# Patient Record
Sex: Female | Born: 1990 | Race: Black or African American | Hispanic: No | Marital: Married | Smoking: Former smoker
Health system: Southern US, Community
[De-identification: ages and names within clinical notes are randomized; demographics above are authoritative.]

## PROBLEM LIST (undated history)

## (undated) DIAGNOSIS — N200 Calculus of kidney: Secondary | ICD-10-CM

## (undated) HISTORY — PX: COSMETIC SURGERY: SHX468

---
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
## 2010-01-22 ENCOUNTER — Emergency Department (HOSPITAL_COMMUNITY): Admission: EM | Admit: 2010-01-22 | Discharge: 2010-01-23 | Payer: Self-pay | Admitting: Emergency Medicine

## 2011-03-16 LAB — WET PREP, GENITAL
Clue Cells Wet Prep HPF POC: NONE SEEN
Trich, Wet Prep: NONE SEEN

## 2011-03-16 LAB — GC/CHLAMYDIA PROBE AMP, GENITAL: Chlamydia, DNA Probe: NEGATIVE

## 2011-03-16 LAB — URINALYSIS, ROUTINE W REFLEX MICROSCOPIC
Bilirubin Urine: NEGATIVE
Glucose, UA: NEGATIVE mg/dL
Ketones, ur: NEGATIVE mg/dL
pH: 6 (ref 5.0–8.0)

## 2011-03-16 LAB — URINE MICROSCOPIC-ADD ON

## 2012-02-14 ENCOUNTER — Encounter (HOSPITAL_COMMUNITY): Payer: Self-pay | Admitting: *Deleted

## 2012-02-14 ENCOUNTER — Emergency Department (INDEPENDENT_AMBULATORY_CARE_PROVIDER_SITE_OTHER)
Admission: EM | Admit: 2012-02-14 | Discharge: 2012-02-14 | Disposition: A | Discharge status: Home / Self Care | Payer: Self-pay | Source: Home / Self Care | Attending: Emergency Medicine | Admitting: Emergency Medicine

## 2012-02-14 DIAGNOSIS — N938 Other specified abnormal uterine and vaginal bleeding: Secondary | ICD-10-CM

## 2012-02-14 DIAGNOSIS — N949 Unspecified condition associated with female genital organs and menstrual cycle: Secondary | ICD-10-CM

## 2012-02-14 DIAGNOSIS — N926 Irregular menstruation, unspecified: Secondary | ICD-10-CM

## 2012-02-14 HISTORY — DX: Calculus of kidney: N20.0

## 2012-02-14 LAB — POCT URINALYSIS DIP (DEVICE)
Bilirubin Urine: NEGATIVE
Glucose, UA: NEGATIVE mg/dL
Nitrite: NEGATIVE
Protein, ur: NEGATIVE mg/dL
Urobilinogen, UA: 0.2 mg/dL (ref 0.0–1.0)

## 2012-02-14 LAB — WET PREP, GENITAL: Trich, Wet Prep: NONE SEEN

## 2012-02-14 MED ORDER — IBUPROFEN 800 MG PO TABS: 800.0000 mg | ORAL_TABLET | Freq: Three times a day (TID) | ORAL | Status: AC

## 2012-02-14 MED ORDER — NORGESTIMATE-ETH ESTRADIOL 0.25-35 MG-MCG PO TABS: ORAL_TABLET | ORAL | Status: DC

## 2012-02-14 NOTE — ED Notes (Signed)
Pt with c/o abdominal cramping with onset of menstrual cycle x one week ago - worse cramps she has ever had - per pt has heavy bleeding with every period

## 2012-02-14 NOTE — ED Provider Notes (Signed)
Chief Complaint  Patient presents with  . Abdominal Cramping    History of Present Illness:  Nancy Knight is a 21 year old female who has had a prolonged menstrual period. This began 9 days ago and is still going on. She in general notes a history of prolonged, painful menses. This menstrual period is about the same is a normal flow. She denies any clots or tissue. She's had some crampy pains. The cramps are a little bit worse than normal menstrual flow but she normally has cramps with all her periods. The pain is controllable with ibuprofen. She felt feverish and chilled and felt nauseated. Her prior menstrual period was January 7 and was normal. She is sexually active only with a single female partner. She states it's been over 2 years since she's been sexually active with a female partner.  Review of Systems:  Other than noted above, the patient denies any of the following symptoms: Systemic:  No fever, chills, sweats, fatigue, or weight loss. GI:  No abdominal pain, nausea, anorexia, vomiting, diarrhea, constipation, melena or hematochezia. GU:  No dysuria, frequency, urgency, hematuria, vaginal discharge, itching, or abnormal vaginal bleeding. Skin:  No rash or itching.   PMFSH:  Past medical history, family history, social history, meds, and allergies were reviewed.  Physical Exam:   Vital signs:  BP 133/76  Pulse 78  Temp(Src) 98.6 F (37 C) (Oral)  Resp 18  SpO2 100%  LMP 02/06/2012 General:  Alert, oriented and in no distress. Lungs:  Breath sounds clear and equal bilaterally.  No wheezes, rales or rhonchi. Heart:  Regular rhythm.  No gallops or murmers. Abdomen:  Soft, flat and non-distended.  No organomegaly or mass.  No tenderness, guarding or rebound.  Bowel sounds normally active. Pelvic exam:  Normal external genitalia. Vaginal and cervical mucosa are normal. There was just a small amount of blood in the vaginal vault. No clots or tissue. The cervix was closed. No cervical motion  tenderness. Uterus normal size and nontender. No adnexal masses or tenderness. Skin:  Clear, warm and dry.  Labs:   Results for orders placed during the hospital encounter of 02/14/12  POCT URINALYSIS DIP (DEVICE)      Component Value Range   Glucose, UA NEGATIVE  NEGATIVE (mg/dL)   Bilirubin Urine NEGATIVE  NEGATIVE    Ketones, ur TRACE (*) NEGATIVE (mg/dL)   Specific Gravity, Urine 1.025  1.005 - 1.030    Hgb urine dipstick MODERATE (*) NEGATIVE    pH 5.5  5.0 - 8.0    Protein, ur NEGATIVE  NEGATIVE (mg/dL)   Urobilinogen, UA 0.2  0.0 - 1.0 (mg/dL)   Nitrite NEGATIVE  NEGATIVE    Leukocytes, UA NEGATIVE  NEGATIVE   POCT PREGNANCY, URINE      Component Value Range   Preg Test, Ur NEGATIVE  NEGATIVE   WET PREP, GENITAL      Component Value Range   Yeast Wet Prep HPF POC NONE SEEN  NONE SEEN    Trich, Wet Prep NONE SEEN  NONE SEEN    Clue Cells Wet Prep HPF POC FEW (*) NONE SEEN    WBC, Wet Prep HPF POC FEW (*) NONE SEEN      Assessment:   Diagnoses that have been ruled out:  None  Diagnoses that are still under consideration:  None  Final diagnoses:  Dysfunctional uterine bleeding    Plan:   1.  The following meds were prescribed:   New Prescriptions   IBUPROFEN (ADVIL,MOTRIN)  800 MG TABLET    Take 1 tablet (800 mg total) by mouth 3 (three) times daily.   NORGESTIMATE-ETHINYL ESTRADIOL (SPRINTEC 28) 0.25-35 MG-MCG TABLET    Take 1 TID for 1 week, then 1 daily for 3 weeks.   2.  The patient was instructed in symptomatic care and handouts were given. 3.  The patient was told to return if becoming worse in any way, if no better in 3 or 4 days, and given some red flag symptoms that would indicate earlier return.  Follow up:  The patient was told to follow up with [facility] for a Pap smear, pelvic exam, and consider a pelvic ultrasound.     Roque Lias, MD 02/14/12 2045

## 2012-02-14 NOTE — Discharge Instructions (Signed)
Abnormal Uterine Bleeding Abnormal uterine bleeding can have many causes. Some cases are simply treated, while others are more serious. There are several kinds of bleeding that is considered abnormal, including:  Bleeding between periods.     Bleeding after sexual intercourse.     Spotting anytime in the menstrual cycle.     Bleeding heavier or more than normal.     Bleeding after menopause.  CAUSES   There are many causes of abnormal uterine bleeding. It can be present in teenagers, pregnant women, women during their reproductive years, and women who have reached menopause. Your caregiver will look for the more common causes depending on your age, signs, symptoms and your particular circumstance. Most cases are not serious and can be treated. Even the more serious causes, like cancer of the female organs, can be treated adequately if found in the early stages. That is why all types of bleeding should be evaluated and treated as soon as possible. DIAGNOSIS   Diagnosing the cause may take several kinds of tests. Your caregiver may:  Take a complete history of the type of bleeding.     Perform a complete physical exam and Pap smear.     Take an ultrasound on the abdomen showing a picture of the female organs and the pelvis.     Inject dye into the uterus and Fallopian tubes and X-ray them (hysterosalpingogram).     Place fluid in the uterus and do an ultrasound (sonohysterogrqphy).     Take a CT scan to examine the female organs and pelvis.     Take an MRI to examine the female organs and pelvis. There is no X-ray involved with this procedure.     Look inside the uterus with a telescope that has a light at the end (hysteroscopy).     Scrap the inside of the uterus to get tissue to examine (Dilatation and Curettage, D&C).     Look into the pelvis with a telescope that has a light at the end (laparoscopy). This is done through a very small cut (incision) in the abdomen.  TREATMENT     Treatment will depend on the cause of the abnormal bleeding. It can include:  Doing nothing to allow the problem to take care of itself over time.     Hormone treatment.     Birth control pills.     Treating the medical condition causing the problem.     Laparoscopy.    Major or minor surgery     Destroying the lining of the uterus with electrical currant, laser, freezing or heat (uterine ablation).  HOME CARE INSTRUCTIONS    Follow your caregiver's recommendation on how to treat your problem.     See your caregiver if you missed a menstrual period and think you may be pregnant.     If you are bleeding heavily, count the number of pads/tampons you use and how often you have to change them. Tell this to your caregiver.     Avoid sexual intercourse until the problem is controlled.  SEEK MEDICAL CARE IF:    You have any kind of abnormal bleeding mentioned above.     You feel dizzy at times.     You are 21 years old and have not had a menstrual period yet.  SEEK IMMEDIATE MEDICAL CARE IF:    You pass out.     You are changing pads/tampons every 15 to 30 minutes.     You have belly (abdominal)   pain.     You have a temperature of 100 F (37.8 C) or higher.     You become sweaty or weak.     You are passing large blood clots from the vagina.     You start to feel sick to your stomach (nauseous) and throw up (vomit).  Document Released: 12/15/2005 Document Revised: 08/27/2011 Document Reviewed: 05/10/2009 [employer] Patient Information 2012 [employer]. 

## 2012-02-17 LAB — GC/CHLAMYDIA PROBE AMP, GENITAL
Chlamydia, DNA Probe: NEGATIVE
GC Probe Amp, Genital: POSITIVE — AB

## 2012-02-18 MED ORDER — CEFTRIAXONE SODIUM 250 MG IJ SOLR: 250.0000 mg | Freq: Once | INTRAMUSCULAR | Status: AC

## 2012-02-18 MED ORDER — AZITHROMYCIN 250 MG PO TABS: 1000.0000 mg | ORAL_TABLET | Freq: Once | ORAL | Status: AC

## 2012-02-20 ENCOUNTER — Emergency Department (INDEPENDENT_AMBULATORY_CARE_PROVIDER_SITE_OTHER)
Admission: EM | Admit: 2012-02-20 | Discharge: 2012-02-20 | Disposition: A | Discharge status: Home / Self Care | Payer: Self-pay | Source: Home / Self Care | Attending: Emergency Medicine | Admitting: Emergency Medicine

## 2012-02-20 ENCOUNTER — Encounter (HOSPITAL_COMMUNITY): Payer: Self-pay | Admitting: *Deleted

## 2012-02-20 DIAGNOSIS — A549 Gonococcal infection, unspecified: Secondary | ICD-10-CM

## 2012-02-20 DIAGNOSIS — A5400 Gonococcal infection of lower genitourinary tract, unspecified: Secondary | ICD-10-CM

## 2012-02-20 LAB — POCT URINALYSIS DIP (DEVICE): Leukocytes, UA: NEGATIVE

## 2012-02-20 LAB — POCT PREGNANCY, URINE: Preg Test, Ur: NEGATIVE

## 2012-02-20 MED ORDER — AZITHROMYCIN 250 MG PO TABS
1000.0000 mg | ORAL_TABLET | Freq: Every day | ORAL | Status: DC
  Administered 2012-02-20 (×3): 1000 mg via ORAL

## 2012-02-20 MED ORDER — AZITHROMYCIN 250 MG PO TABS
ORAL_TABLET | ORAL | Status: AC
  Filled 2012-02-20: qty 4

## 2012-02-20 MED ORDER — CEFTRIAXONE SODIUM 250 MG IJ SOLR
250.0000 mg | Freq: Once | INTRAMUSCULAR | Status: AC
  Administered 2012-02-20: 250 mg via INTRAMUSCULAR

## 2012-02-20 MED ORDER — CEFTRIAXONE SODIUM 250 MG IJ SOLR
INTRAMUSCULAR | Status: AC
  Filled 2012-02-20: qty 250

## 2012-02-20 NOTE — ED Notes (Signed)
Pt  Only  Given        zithromax  1  Gm  And  rocephen 250  Mg im       1  Dose  Each

## 2012-02-20 NOTE — ED Provider Notes (Signed)
History     CSN: [id number]  Arrival date & time 02/20/12  1635   First MD Initiated Contact with Patient 02/20/12 1720      Chief Complaint  Patient presents with  . SEXUALLY TRANSMITTED DISEASE    (Consider location/radiation/quality/duration/timing/severity/associated sxs/prior treatment) HPI Comments: Patient presented today to urgent care after receiving a phone call from our clinic in which she was explained that she tested positive for gonorrhea after having been screened by one of our providers. Patient denies any pelvic pain or fevers.  The history is provided by the patient.    Past Medical History  Diagnosis Date  . Kidney stone     Past Surgical History  Procedure Date  . Cosmetic surgery     History reviewed. No pertinent family history.  History  Substance Use Topics  . Smoking status: Former Games developer  . Smokeless tobacco: Not on file  . Alcohol Use: Yes    OB History    Grav Para Term Preterm Abortions TAB SAB Ect Mult Living                  Review of Systems  Constitutional: Negative for fever, appetite change and fatigue.  Cardiovascular: Negative for chest pain.  Genitourinary: Negative for dysuria, urgency, vaginal bleeding, vaginal discharge, vaginal pain and pelvic pain.  Neurological: Negative for dizziness.    Allergies  Review of patient's allergies indicates no known allergies.  Home Medications   Current Outpatient Rx  Name Route Sig Dispense Refill  . ACETAMINOPHEN 325 MG PO TABS Oral Take 650 mg by mouth every 6 (six) hours as needed.    . IBUPROFEN 800 MG PO TABS Oral Take 800 mg by mouth every 8 (eight) hours as needed.    . IBUPROFEN 800 MG PO TABS Oral Take 1 tablet (800 mg total) by mouth 3 (three) times daily. 30 tablet 0  . NAPROXEN SODIUM 220 MG PO TABS Oral Take 220 mg by mouth 2 (two) times daily with a meal.    . NORGESTIMATE-ETH ESTRADIOL 0.25-35 MG-MCG PO TABS  Take 1 TID for 1 week, then 1 daily for 3 weeks. 2  Package 11    BP 125/57  Pulse 88  Temp(Src) 98.3 F (36.8 C) (Oral)  Resp 18  SpO2 98%  LMP 02/06/2012  Physical Exam  Nursing note and vitals reviewed. Constitutional: She appears well-developed and well-nourished.  HENT:  Mouth/Throat: No oropharyngeal exudate.  Eyes: Conjunctivae are normal. No scleral icterus.  Neck: Neck supple. No JVD present.  Abdominal: Soft. She exhibits no distension. There is no hepatosplenomegaly or hepatomegaly. There is no tenderness. There is no rigidity, no rebound, no guarding, no tenderness at McBurney's point and negative Murphy's sign.  Genitourinary: Vagina normal.  Lymphadenopathy:    She has no cervical adenopathy.  Skin: No rash noted. No erythema.    ED Course  Procedures (including critical care time)  Labs Reviewed  POCT URINALYSIS DIP (DEVICE) - Abnormal; Notable for the following:    Ketones, ur TRACE (*)    All other components within normal limits  POCT PREGNANCY, URINE   No results found.   1. Gonorrhea       MDM  Patient returns today to receive treatment for positive gonorrhea test. Patient denies any pelvic pain any fevers or gastrointestinal symptoms.        Jimmie Molly, MD 02/20/12 [phone]

## 2012-02-20 NOTE — ED Notes (Signed)
Pt  Has  Positive    Lab  Results     She  Is  Here  To  Receive     meds  For   Positive  Lab  Results

## 2012-02-20 NOTE — ED Notes (Signed)
Here  For  Med  For  gc

## 2012-02-23 ENCOUNTER — Emergency Department (HOSPITAL_COMMUNITY)
Admission: EM | Admit: 2012-02-23 | Discharge: 2012-02-23 | Disposition: A | Discharge status: Home / Self Care | Payer: Self-pay | Attending: Emergency Medicine | Admitting: Emergency Medicine

## 2012-02-23 ENCOUNTER — Emergency Department (HOSPITAL_COMMUNITY): Payer: Self-pay

## 2012-02-23 ENCOUNTER — Encounter (HOSPITAL_COMMUNITY): Payer: Self-pay | Admitting: *Deleted

## 2012-02-23 DIAGNOSIS — T148XXA Other injury of unspecified body region, initial encounter: Secondary | ICD-10-CM

## 2012-02-23 DIAGNOSIS — R040 Epistaxis: Secondary | ICD-10-CM | POA: Insufficient documentation

## 2012-02-23 DIAGNOSIS — R062 Wheezing: Secondary | ICD-10-CM | POA: Insufficient documentation

## 2012-02-23 DIAGNOSIS — R05 Cough: Secondary | ICD-10-CM | POA: Insufficient documentation

## 2012-02-23 DIAGNOSIS — R509 Fever, unspecified: Secondary | ICD-10-CM | POA: Insufficient documentation

## 2012-02-23 DIAGNOSIS — J40 Bronchitis, not specified as acute or chronic: Secondary | ICD-10-CM | POA: Insufficient documentation

## 2012-02-23 DIAGNOSIS — S1093XA Contusion of unspecified part of neck, initial encounter: Secondary | ICD-10-CM | POA: Insufficient documentation

## 2012-02-23 DIAGNOSIS — S0003XA Contusion of scalp, initial encounter: Secondary | ICD-10-CM | POA: Insufficient documentation

## 2012-02-23 DIAGNOSIS — S0990XA Unspecified injury of head, initial encounter: Secondary | ICD-10-CM | POA: Insufficient documentation

## 2012-02-23 DIAGNOSIS — R51 Headache: Secondary | ICD-10-CM | POA: Insufficient documentation

## 2012-02-23 DIAGNOSIS — R059 Cough, unspecified: Secondary | ICD-10-CM | POA: Insufficient documentation

## 2012-02-23 DIAGNOSIS — R220 Localized swelling, mass and lump, head: Secondary | ICD-10-CM | POA: Insufficient documentation

## 2012-02-23 MED ORDER — ALBUTEROL SULFATE HFA 108 (90 BASE) MCG/ACT IN AERS
2.0000 | INHALATION_SPRAY | RESPIRATORY_TRACT | Status: DC | PRN
  Administered 2012-02-23: 2 via RESPIRATORY_TRACT
  Filled 2012-02-23: qty 6.7

## 2012-02-23 MED ORDER — HYDROCOD POLST-CHLORPHEN POLST 10-8 MG/5ML PO LQCR
5.0000 mL | Freq: Once | ORAL | Status: AC
  Administered 2012-02-23: 5 mL via ORAL
  Filled 2012-02-23: qty 5

## 2012-02-23 MED ORDER — BENZONATATE 100 MG PO CAPS: 100.0000 mg | ORAL_CAPSULE | Freq: Three times a day (TID) | ORAL | Status: AC
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
## 2012-02-23 NOTE — ED Notes (Signed)
Ambulated to Radiology and back. Coughing.
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
## 2012-02-24 ENCOUNTER — Telehealth (HOSPITAL_COMMUNITY): Payer: Self-pay | Admitting: *Deleted
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
## 2012-02-24 NOTE — ED Provider Notes (Signed)
Medical screening examination/treatment/procedure(s) were performed by non-physician practitioner and as supervising physician I was immediately available for consultation/collaboration.   Benny Lennert, MD 02/24/12 0030

## 2013-11-16 ENCOUNTER — Emergency Department (HOSPITAL_COMMUNITY)
Admission: EM | Admit: 2013-11-16 | Discharge: 2013-11-16 | Disposition: A | Discharge status: Home / Self Care | Payer: No Typology Code available for payment source | Attending: Emergency Medicine | Admitting: Emergency Medicine

## 2013-11-16 DIAGNOSIS — S46909A Unspecified injury of unspecified muscle, fascia and tendon at shoulder and upper arm level, unspecified arm, initial encounter: Secondary | ICD-10-CM | POA: Insufficient documentation

## 2013-11-16 DIAGNOSIS — Z87442 Personal history of urinary calculi: Secondary | ICD-10-CM | POA: Insufficient documentation

## 2013-11-16 DIAGNOSIS — S0993XA Unspecified injury of face, initial encounter: Secondary | ICD-10-CM | POA: Insufficient documentation

## 2013-11-16 DIAGNOSIS — Y9289 Other specified places as the place of occurrence of the external cause: Secondary | ICD-10-CM | POA: Insufficient documentation

## 2013-11-16 DIAGNOSIS — M542 Cervicalgia: Secondary | ICD-10-CM

## 2013-11-16 DIAGNOSIS — S4980XA Other specified injuries of shoulder and upper arm, unspecified arm, initial encounter: Secondary | ICD-10-CM | POA: Insufficient documentation

## 2013-11-16 DIAGNOSIS — Y9389 Activity, other specified: Secondary | ICD-10-CM | POA: Insufficient documentation

## 2013-11-16 DIAGNOSIS — Z87891 Personal history of nicotine dependence: Secondary | ICD-10-CM | POA: Insufficient documentation

## 2013-11-16 DIAGNOSIS — Z791 Long term (current) use of non-steroidal anti-inflammatories (NSAID): Secondary | ICD-10-CM | POA: Insufficient documentation

## 2013-11-16 MED ORDER — CYCLOBENZAPRINE HCL 10 MG PO TABS: 10.0000 mg | ORAL_TABLET | Freq: Two times a day (BID) | ORAL | Status: AC | PRN

## 2013-11-16 MED ORDER — NAPROXEN 250 MG PO TABS
500.0000 mg | ORAL_TABLET | Freq: Once | ORAL | Status: AC
  Administered 2013-11-16: 500 mg via ORAL
  Filled 2013-11-16: qty 2

## 2013-11-16 MED ORDER — NAPROXEN 500 MG PO TABS: 500.0000 mg | ORAL_TABLET | Freq: Two times a day (BID) | ORAL | Status: AC
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
## 2013-11-21 NOTE — ED Provider Notes (Signed)
Medical screening examination/treatment/procedure(s) were performed by non-physician practitioner and as supervising physician I was immediately available for consultation/collaboration.  EKG Interpretation   None        Kaidynce Pfister R. Asiya Cutbirth, MD 11/21/13 1556 

## 2020-12-22 ENCOUNTER — Emergency Department (HOSPITAL_COMMUNITY)
Admission: EM | Admit: 2020-12-22 | Discharge: 2020-12-22 | Disposition: A | Discharge status: Home / Self Care | Payer: Managed Care, Other (non HMO) | Attending: Pediatric Gastroenterology | Admitting: Pediatric Gastroenterology

## 2020-12-22 ENCOUNTER — Encounter (HOSPITAL_COMMUNITY): Payer: Self-pay

## 2020-12-22 ENCOUNTER — Emergency Department (HOSPITAL_COMMUNITY): Payer: Managed Care, Other (non HMO)

## 2020-12-22 ENCOUNTER — Other Ambulatory Visit: Payer: Self-pay

## 2020-12-22 DIAGNOSIS — Z20822 Contact with and (suspected) exposure to covid-19: Secondary | ICD-10-CM | POA: Diagnosis not present

## 2020-12-22 DIAGNOSIS — Z87891 Personal history of nicotine dependence: Secondary | ICD-10-CM | POA: Diagnosis not present

## 2020-12-22 DIAGNOSIS — J029 Acute pharyngitis, unspecified: Secondary | ICD-10-CM | POA: Diagnosis not present

## 2020-12-22 DIAGNOSIS — R21 Rash and other nonspecific skin eruption: Secondary | ICD-10-CM | POA: Insufficient documentation

## 2020-12-22 LAB — RESP PANEL BY RT-PCR (FLU A&B, COVID) ARPGX2
Influenza A by PCR: NEGATIVE
Influenza B by PCR: NEGATIVE
SARS Coronavirus 2 by RT PCR: NEGATIVE

## 2020-12-22 LAB — GROUP A STREP BY PCR: Group A Strep by PCR: NOT DETECTED

## 2020-12-22 MED ORDER — ACETAMINOPHEN 325 MG PO TABS
650.0000 mg | ORAL_TABLET | Freq: Once | ORAL | Status: AC | PRN
  Administered 2020-12-22: 650 mg via ORAL
  Filled 2020-12-22: qty 2

## 2020-12-22 MED ORDER — FLUCONAZOLE 150 MG PO TABS: 150.0000 mg | ORAL_TABLET | Freq: Once | ORAL | 0 refills | Status: AC

## 2020-12-22 MED ORDER — CEPHALEXIN 500 MG PO CAPS: 500.0000 mg | ORAL_CAPSULE | Freq: Two times a day (BID) | ORAL | 0 refills | Status: AC

## 2020-12-22 MED ORDER — CEPHALEXIN 500 MG PO CAPS: 500.0000 mg | ORAL_CAPSULE | Freq: Two times a day (BID) | ORAL | 0 refills | Status: DC

## 2020-12-22 NOTE — ED Provider Notes (Signed)
[facility] EMERGENCY DEPARTMENT Provider Note   CSN: [id number] Arrival date & time: 12/22/20  3536     History Chief Complaint  Patient presents with  . Rash  . Sore Throat    Nancy Knight is a 29 y.o. female who presents with concern for sore throat and painful rash around Nancy Knight mouth x4 days. Patient states Nancy Knight has been around Nancy Knight sick nephews, slept on a "filthy couch" at the place Nancy Knight gets Nancy Knight nails done. Nancy Knight states that children live there and Nancy Knight erupted with this rash the same evening Nancy Knight got Nancy Knight nails done.   Nancy Knight states that the "blisters" have burst, draining clear to yellow fluid.  Additionally complaining of sore throat, but denies swelling in Nancy Knight neck, difficulty swallowing.  Denies sores in Nancy Knight mouth.  Denies fevers or chills at home.  Endorses congestion, but denies shortness of breath or cough, palpitations, abdominal pain, nausea, vomiting, diarrhea.  Patient is fully vaccinated COVID-19.  I personally reviewed the patient's medical records.  History of kidney stones, cosmetic surgery, but otherwise carries no medical diagnoses and is not on any medications every day.     HPI     Past Medical History:  Diagnosis Date  . Kidney stone     There are no problems to display for this patient.   Past Surgical History:  Procedure Laterality Date  . COSMETIC SURGERY       OB History   No obstetric history on file.     No family history on file.  Social History   Tobacco Use  . Smoking status: Former Smoker  Substance Use Topics  . Alcohol use: Yes  . Drug use: Yes    Types: Marijuana    Home Medications Prior to Admission medications   Medication Sig Start Date End Date Taking? Authorizing Provider  cephALEXin (KEFLEX) 500 MG capsule Take 1 capsule (500 mg total) by mouth 2 (two) times daily for 7 days. 12/22/20 12/29/20  Jionni Helming, Lupe Carney R, PA-C  cyclobenzaprine (FLEXERIL) 10 MG tablet Take 1 tablet (10 mg total) by mouth  2 (two) times daily as needed for muscle spasms. 11/16/13   Emilia Beck, PA-C  ibuprofen (ADVIL,MOTRIN) 200 MG tablet Take 800 mg by mouth every 8 (eight) hours as needed for cramping.    [provider]  naproxen (NAPROSYN) 500 MG tablet Take 1 tablet (500 mg total) by mouth 2 (two) times daily with a meal. 11/16/13   Szekalski, Kaitlyn, PA-C  naproxen sodium (ANAPROX) 220 MG tablet Take 440 mg by mouth 2 (two) times daily as needed (for cramps).    [provider]    Allergies    Patient has no known allergies.  Review of Systems   Review of Systems  Constitutional: Positive for chills. Negative for activity change, appetite change, diaphoresis, fatigue and fever.  HENT: Positive for congestion, sinus pressure, sinus pain and sore throat. Negative for trouble swallowing and voice change.   Eyes: Negative.   Respiratory: Negative for cough, chest tightness, shortness of breath and wheezing.   Cardiovascular: Negative for chest pain, palpitations and leg swelling.  Gastrointestinal: Negative for abdominal pain, nausea and vomiting.  Genitourinary: Negative.   Musculoskeletal: Negative.   Skin: Positive for rash.  Allergic/Immunologic: Negative.   Neurological: Negative.   Hematological: Negative.     Physical Exam Updated Vital Signs BP (!) 147/91   Pulse 96   Temp 99.5 F (37.5 C) (Oral)   Resp 18   LMP  11/17/2020 Comment: patient states Nancy Knight period should be in 3 days. Nancy Knight is on birth control and not sexually active currently  SpO2 97%   Physical Exam Vitals and nursing note reviewed.  Constitutional:      Appearance: Nancy Knight is obese.  HENT:     Head: Normocephalic and atraumatic.     Right Ear: External ear normal.     Left Ear: External ear normal.     Nose: Nose normal.     Mouth/Throat:     Mouth: Mucous membranes are moist.     Pharynx: Uvula midline. Oropharyngeal exudate and posterior oropharyngeal erythema present. No uvula swelling.      Tonsils: Tonsillar exudate present. 2+ on the right. 2+ on the left.      Comments: Posterior pharyngeal white exudate. Bilateral symmetric tonsillar edema, white exudate on tonsils. Eyes:     General:        Right eye: No discharge.        Left eye: No discharge.     Extraocular Movements: Extraocular movements intact.     Conjunctiva/sclera: Conjunctivae normal.     Pupils: Pupils are equal, round, and reactive to light.  Neck:     Trachea: Trachea and phonation normal.  Cardiovascular:     Rate and Rhythm: Normal rate and regular rhythm.     Pulses: Normal pulses.          Radial pulses are 2+ on the right side and 2+ on the left side.       Dorsalis pedis pulses are 2+ on the right side and 2+ on the left side.     Heart sounds: Normal heart sounds. No murmur heard.   Pulmonary:     Effort: Pulmonary effort is normal. No respiratory distress.     Breath sounds: Examination of the right-middle field reveals rales. Examination of the right-lower field reveals rales. Rales present. No wheezing.  Chest:     Chest wall: No lacerations, deformity, swelling, tenderness, crepitus or edema.  Abdominal:     General: Bowel sounds are normal. There is no distension.     Tenderness: There is no abdominal tenderness.  Musculoskeletal:        General: No deformity.     Cervical back: Neck supple. No rigidity or crepitus. No pain with movement, spinous process tenderness or muscular tenderness.     Right lower leg: No edema.     Left lower leg: No edema.  Lymphadenopathy:     Cervical: Cervical adenopathy present.     Right cervical: Superficial cervical adenopathy present.     Left cervical: Superficial cervical adenopathy present.  Skin:    General: Skin is warm and dry.     Findings: Rash present. Rash is crusting.     Comments: No lesions on the buccal mucosae, palms, or soles of the feet.   Neurological:     Mental Status: Nancy Knight is alert. Mental status is at baseline.   Psychiatric:        Mood and Affect: Mood normal.           ED Results / Procedures / Treatments   Labs (all labs ordered are listed, but only abnormal results are displayed) Labs Reviewed  GROUP A STREP BY PCR  RESP PANEL BY RT-PCR (FLU A&B, COVID) [id number]    EKG None  Radiology DG Chest Portable 1 View  Result Date: 12/22/2020 CLINICAL DATA:  Reason for exam: wheezing Chief complaints: rash; sore throat Pt reports rash  to under Nancy Knight lips since yesterday, states Nancy Knight slept on a dirty couch and woke up with a rash. Pt also reports a sore throat for 3 days EXAM: PORTABLE CHEST - 1 VIEW COMPARISON:  02/23/2012 FINDINGS: Lungs are clear. Heart size and mediastinal contours are within normal limits. No effusion.  Obesity. Visualized bones unremarkable. IMPRESSION: No acute cardiopulmonary disease. Electronically Signed   By: Corlis Leak M.D.   On: 12/22/2020 11:06    Procedures Procedures (including critical care time)  Medications Ordered in ED Medications  acetaminophen (TYLENOL) tablet 650 mg (650 mg Oral Given 12/22/20 1003)    ED Course  I have reviewed the triage vital signs and the nursing notes.  Pertinent labs & imaging results that were available during my care of the patient were reviewed by me and considered in my medical decision making (see chart for details).  Clinical Course as of 12/22/20 1236  Sat Dec 22, 2020  [id number] 30 year old female with painful rash around Nancy Knight mouth that started yesterday.  Also has a sore throat.  Febrile here.  Nontoxic-appearing.  Has a rash around Nancy Knight face question impetigo question molluscum.  Getting some strep and Covid flu testing. [MB]    Clinical Course User Index [MB] Terrilee Files, MD   MDM Rules/Calculators/A&P                         29 year old female presents with concern for painful rash on Nancy Knight face for the last 4 days, sore throat.  Patient is vaccinated for COVID-19.  Differential diagnosis for this patient's  rash includes but is not limited to impetigo, molluscum contagiosum, allergic reaction, varicella, hand-foot-and-mouth.   Patient febrile on intake to 101.3 degrees Fahrenheit.  Hypertensive to 147/91.  Physical exam concerning for mild rales in the lung bases, tonsillar edema and exudate, rash on face.  We will proceed with Covid, strep test, chest x-ray.    Patient negative for COVID-19, influenza A/B.  Negative for strep pharyngitis.  Chest x-ray negative for acute cardiopulmonary disease.  Feel rash is most consistent with impetigo versus molluscum contagiosum at this time.  Will proceed with antibiotic therapy in outpatient setting.  No further work-up warranted emergency department at this time given reassuring laboratory studies and vital signs.  [place] voiced understanding of Nancy Knight medical evaluation and treatment plan. Each of Nancy Knight questions were answered to Nancy Knight expressed satisfaction.  Return precautions were given.  Work note provided.  Patient is well-appearing, stable, and appropriate for discharge at this time.   Final Clinical Impression(s) / ED Diagnoses Final diagnoses:  Rash    Rx / DC Orders ED Discharge Orders         Ordered    cephALEXin (KEFLEX) 500 MG capsule  2 times daily,   Status:  Discontinued        12/22/20 1219    cephALEXin (KEFLEX) 500 MG capsule  2 times daily        12/22/20 [address] Eugene Gavia, PA-C 12/22/20 1236    Terrilee Files, MD 12/22/20 2205

## 2020-12-22 NOTE — Discharge Instructions (Addendum)
You were evaluated in the emergency department today for the rash on the face and your sore throat.    You had a fever in the emergency department, and were administered tylenol; otherwise your vital signs were normal.  Tested negative for strep throat and COVID-19, influenza A/B.  Your chest x-ray also was normal.  This is good news.  I suspect that the rash on your face is called Impetigo; this is treated with an antibiotic. You have been prescribed an antibiotic called cephalexin.  Please take this as prescribed for the entire course. May continue take Tylenol .ibuprofen as needed for your fever and discomfort.  This rash can be contagious to those in close proximity to you.   You may follow-up with your primary care doctor.  You may return the emergency department feel any worsening sore throat, difficulty swallowing, pain in your neck, chest pain, shortness of breath, palpitations, nausea or vomiting that does not stop, or any other new severe symptoms.

## 2020-12-22 NOTE — ED Triage Notes (Signed)
Pt reports rash to under her lips since yesterday, states she slept on a dirty couch and woke up with a rash. Pt also reports a sore throat for 3 days. Fully vaccinated for COVID but has been around her sick nephews.

## 2021-07-27 IMAGING — DX DG CHEST 1V PORT
1 series · 1 of 1 positions shown · non-contrast
Comparison: 02/23/2012

CLINICAL DATA: Reason for exam: wheezing Chief complaints: rash;
sore throat Pt reports rash to under her lips since yesterday,
states she slept on a dirty couch and woke up with a rash. Pt also
reports a sore throat for 3 days

EXAM:
PORTABLE CHEST - 1 VIEW

[chest ap]
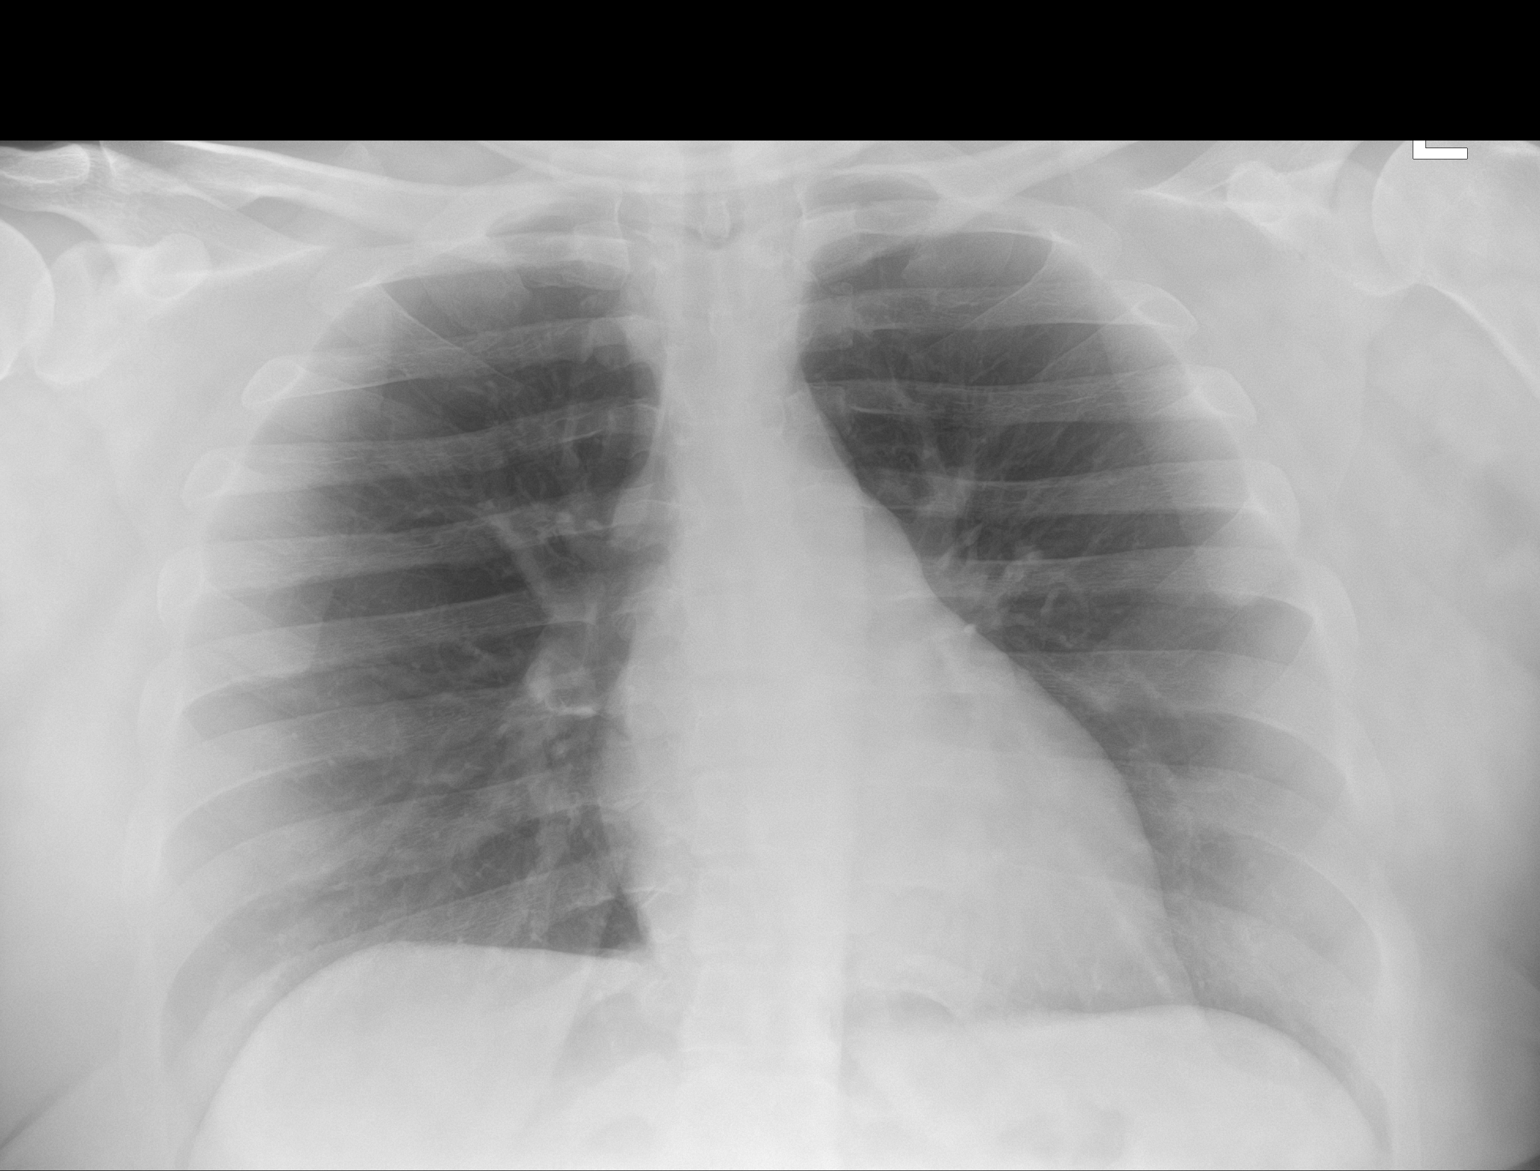

[1 of 1 positions shown; findings below may reference images not displayed]

FINDINGS: Lungs are clear.

Heart size and mediastinal contours are within normal limits.

No effusion.  Obesity.

Visualized bones unremarkable.
IMPRESSION: No acute cardiopulmonary disease.

## 2023-07-01 ENCOUNTER — Institutional Professional Consult (permissible substitution): Payer: Managed Care, Other (non HMO) | Admitting: Adult Health
